# Patient Record
Sex: Female | Born: 1945 | Race: White | Hispanic: No | Marital: Married | Smoking: Never smoker
Health system: Southern US, Community
[De-identification: ages and names within clinical notes are randomized; demographics above are authoritative.]

## PROBLEM LIST (undated history)

## (undated) HISTORY — PX: APPENDECTOMY: SHX54

## (undated) HISTORY — PX: TONSILLECTOMY: SUR1361

---

## 2011-08-22 DIAGNOSIS — H2510 Age-related nuclear cataract, unspecified eye: Secondary | ICD-10-CM | POA: Diagnosis not present

## 2011-09-13 DIAGNOSIS — H2510 Age-related nuclear cataract, unspecified eye: Secondary | ICD-10-CM | POA: Diagnosis not present

## 2011-09-23 DIAGNOSIS — H2510 Age-related nuclear cataract, unspecified eye: Secondary | ICD-10-CM | POA: Diagnosis not present

## 2011-10-17 ENCOUNTER — Ambulatory Visit: Payer: Self-pay | Admitting: Ophthalmology

## 2011-10-17 DIAGNOSIS — H2510 Age-related nuclear cataract, unspecified eye: Secondary | ICD-10-CM | POA: Diagnosis not present

## 2011-10-17 DIAGNOSIS — Z79899 Other long term (current) drug therapy: Secondary | ICD-10-CM | POA: Diagnosis not present

## 2011-10-17 DIAGNOSIS — K219 Gastro-esophageal reflux disease without esophagitis: Secondary | ICD-10-CM | POA: Diagnosis not present

## 2011-10-17 DIAGNOSIS — Z9109 Other allergy status, other than to drugs and biological substances: Secondary | ICD-10-CM | POA: Diagnosis not present

## 2012-10-23 DIAGNOSIS — H2510 Age-related nuclear cataract, unspecified eye: Secondary | ICD-10-CM | POA: Diagnosis not present

## 2012-11-12 DIAGNOSIS — H2510 Age-related nuclear cataract, unspecified eye: Secondary | ICD-10-CM | POA: Diagnosis not present

## 2012-11-21 ENCOUNTER — Ambulatory Visit: Payer: Self-pay | Admitting: Ophthalmology

## 2012-11-21 DIAGNOSIS — Z79899 Other long term (current) drug therapy: Secondary | ICD-10-CM | POA: Diagnosis not present

## 2012-11-21 DIAGNOSIS — Z9109 Other allergy status, other than to drugs and biological substances: Secondary | ICD-10-CM | POA: Diagnosis not present

## 2012-11-21 DIAGNOSIS — H259 Unspecified age-related cataract: Secondary | ICD-10-CM | POA: Diagnosis not present

## 2012-11-21 DIAGNOSIS — R12 Heartburn: Secondary | ICD-10-CM | POA: Diagnosis not present

## 2012-11-21 DIAGNOSIS — H2510 Age-related nuclear cataract, unspecified eye: Secondary | ICD-10-CM | POA: Diagnosis not present

## 2013-07-15 DIAGNOSIS — H35059 Retinal neovascularization, unspecified, unspecified eye: Secondary | ICD-10-CM | POA: Diagnosis not present

## 2013-07-15 DIAGNOSIS — H538 Other visual disturbances: Secondary | ICD-10-CM | POA: Diagnosis not present

## 2013-09-02 DIAGNOSIS — H35059 Retinal neovascularization, unspecified, unspecified eye: Secondary | ICD-10-CM | POA: Diagnosis not present

## 2014-08-25 ENCOUNTER — Ambulatory Visit
Admit: 2014-08-25 | Disposition: A | Discharge status: Home / Self Care | Payer: Self-pay | Attending: Family Medicine | Admitting: Family Medicine

## 2014-08-25 DIAGNOSIS — S4991XA Unspecified injury of right shoulder and upper arm, initial encounter: Secondary | ICD-10-CM | POA: Diagnosis not present

## 2014-08-25 DIAGNOSIS — M7521 Bicipital tendinitis, right shoulder: Secondary | ICD-10-CM | POA: Diagnosis not present

## 2014-08-25 DIAGNOSIS — S63601A Unspecified sprain of right thumb, initial encounter: Secondary | ICD-10-CM | POA: Diagnosis not present

## 2014-08-25 DIAGNOSIS — Y9389 Activity, other specified: Secondary | ICD-10-CM | POA: Diagnosis not present

## 2014-08-25 DIAGNOSIS — S6991XA Unspecified injury of right wrist, hand and finger(s), initial encounter: Secondary | ICD-10-CM | POA: Diagnosis not present

## 2014-08-25 DIAGNOSIS — M79644 Pain in right finger(s): Secondary | ICD-10-CM | POA: Diagnosis not present

## 2014-08-25 DIAGNOSIS — Y92009 Unspecified place in unspecified non-institutional (private) residence as the place of occurrence of the external cause: Secondary | ICD-10-CM | POA: Diagnosis not present

## 2014-08-29 DIAGNOSIS — M7541 Impingement syndrome of right shoulder: Secondary | ICD-10-CM | POA: Diagnosis not present

## 2014-09-02 DIAGNOSIS — Z818 Family history of other mental and behavioral disorders: Secondary | ICD-10-CM | POA: Diagnosis not present

## 2014-09-02 DIAGNOSIS — J8410 Pulmonary fibrosis, unspecified: Secondary | ICD-10-CM | POA: Diagnosis not present

## 2014-09-02 DIAGNOSIS — Z23 Encounter for immunization: Secondary | ICD-10-CM | POA: Diagnosis not present

## 2014-09-02 DIAGNOSIS — M7521 Bicipital tendinitis, right shoulder: Secondary | ICD-10-CM | POA: Diagnosis not present

## 2014-09-12 DIAGNOSIS — M7541 Impingement syndrome of right shoulder: Secondary | ICD-10-CM | POA: Diagnosis not present

## 2014-09-24 DIAGNOSIS — M25511 Pain in right shoulder: Secondary | ICD-10-CM | POA: Diagnosis not present

## 2014-09-24 DIAGNOSIS — M7501 Adhesive capsulitis of right shoulder: Secondary | ICD-10-CM | POA: Diagnosis not present

## 2014-09-29 DIAGNOSIS — M25511 Pain in right shoulder: Secondary | ICD-10-CM | POA: Diagnosis not present

## 2014-09-29 DIAGNOSIS — M7501 Adhesive capsulitis of right shoulder: Secondary | ICD-10-CM | POA: Diagnosis not present

## 2014-10-02 DIAGNOSIS — M7501 Adhesive capsulitis of right shoulder: Secondary | ICD-10-CM | POA: Diagnosis not present

## 2014-10-02 DIAGNOSIS — M25511 Pain in right shoulder: Secondary | ICD-10-CM | POA: Diagnosis not present

## 2014-10-06 DIAGNOSIS — M25511 Pain in right shoulder: Secondary | ICD-10-CM | POA: Diagnosis not present

## 2014-10-06 DIAGNOSIS — M7501 Adhesive capsulitis of right shoulder: Secondary | ICD-10-CM | POA: Diagnosis not present

## 2015-03-02 DIAGNOSIS — T1501XA Foreign body in cornea, right eye, initial encounter: Secondary | ICD-10-CM | POA: Diagnosis not present

## 2015-03-03 DIAGNOSIS — T1501XD Foreign body in cornea, right eye, subsequent encounter: Secondary | ICD-10-CM | POA: Diagnosis not present

## 2015-03-05 ENCOUNTER — Ambulatory Visit: Payer: Self-pay | Admitting: Family Medicine

## 2015-03-06 ENCOUNTER — Encounter: Payer: Self-pay | Admitting: Family Medicine

## 2015-03-06 ENCOUNTER — Ambulatory Visit (INDEPENDENT_AMBULATORY_CARE_PROVIDER_SITE_OTHER): Payer: Medicare Other | Admitting: Family Medicine

## 2015-03-06 VITALS — BP 138/82 | HR 72 | Ht 65.5 in | Wt 154.8 lb

## 2015-03-06 DIAGNOSIS — H269 Unspecified cataract: Secondary | ICD-10-CM | POA: Diagnosis not present

## 2015-03-06 DIAGNOSIS — E663 Overweight: Secondary | ICD-10-CM | POA: Insufficient documentation

## 2015-03-06 DIAGNOSIS — J8410 Pulmonary fibrosis, unspecified: Secondary | ICD-10-CM

## 2015-03-06 DIAGNOSIS — M7521 Bicipital tendinitis, right shoulder: Secondary | ICD-10-CM | POA: Diagnosis not present

## 2015-03-06 DIAGNOSIS — Z23 Encounter for immunization: Secondary | ICD-10-CM | POA: Diagnosis not present

## 2015-03-06 DIAGNOSIS — Z820 Family history of epilepsy and other diseases of the nervous system: Secondary | ICD-10-CM

## 2015-03-06 DIAGNOSIS — Z818 Family history of other mental and behavioral disorders: Secondary | ICD-10-CM | POA: Insufficient documentation

## 2015-03-06 NOTE — Progress Notes (Signed)
Date:  03/06/2015   Name:  Irena ReichmannBetty Carter Majewski   DOB:  07/27/1945   MRN:  [id number]  PCP:  Schuyler AmorWilliam Theressa Piedra, MD    Chief Complaint: Follow-up   History of Present Illness:  This is a 69 y.o. female for f/u overweight, has lost 15# past six months, feeling well. R biceps tendonitis resolved, off Mobic. Received Tdap and Prevnar last visit, needs Pneumovax. Declines blood work/mammogram/colonoscopy.  Review of Systems:  Review of Systems  Constitutional: Negative for fever and unexpected weight change.  HENT: Negative for ear pain, sore throat and trouble swallowing.   Eyes: Negative for pain.  Respiratory: Negative for shortness of breath.   Cardiovascular: Negative for chest pain and leg swelling.  Gastrointestinal: Negative for abdominal pain.  Endocrine: Negative for polyuria.  Genitourinary: Negative for difficulty urinating.  Neurological: Negative for syncope and light-headedness.    Patient Active Problem List   Diagnosis Date Noted  . 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 Cataracts, bilateral 03/06/2015  . FH: dementia 03/06/2015  . Biceps tendonitis on right 03/06/2015  . Overweight (BMI 25.0-29.9) 03/06/2015    Prior to Admission medications   Not on File    Not on File  No past surgical history on file.  Social History  Substance Use Topics  . Smoking status: Not on file  . Smokeless tobacco: Not on file  . Alcohol Use: 0.0 oz/week    0 Standard drinks or equivalent per week    No family history on file.  Medication list has been reviewed and updated.  Physical Examination: BP 138/82 mmHg  Pulse 72  Ht 5' 5.5" (1.664 m)  Wt 154 lb 12.8 oz (70.217 kg)  BMI 25.36 kg/m2  Physical Exam  Constitutional: She is oriented to person, place, and time. She appears well-developed and well-nourished.  Cardiovascular: Normal rate, regular rhythm and normal heart sounds.   Pulmonary/Chest: Effort normal and breath sounds normal.  Musculoskeletal:  She exhibits no edema.  Neurological: She is alert and oriented to person, place, and time.  Skin: Skin is warm and dry.  Psychiatric: She has a normal mood and affect. Her behavior is normal.  Nursing note and vitals reviewed.   Assessment and Plan:  1. Overweight (BMI 25.0-29.9) Continue weight loss (goal < 150#)  2. Biceps tendonitis on right Resolved  3. Cataracts, bilateral Followed by optho  4. Calcified granuloma of lung (HCC)  5. FH: dementia Declines blood work  6. Need for pneumococcal imm Pneumovax given  Return in about 1 year (around 03/05/2016).  Dionne AnoWilliam M. Kingsley SpittlePlonk, Jr. MD [facility]  03/06/2015

## 2015-07-01 DIAGNOSIS — H538 Other visual disturbances: Secondary | ICD-10-CM | POA: Diagnosis not present

## 2015-08-26 DIAGNOSIS — H26493 Other secondary cataract, bilateral: Secondary | ICD-10-CM | POA: Diagnosis not present

## 2015-09-23 ENCOUNTER — Encounter: Payer: Self-pay | Admitting: Family Medicine

## 2015-09-23 ENCOUNTER — Ambulatory Visit (INDEPENDENT_AMBULATORY_CARE_PROVIDER_SITE_OTHER): Payer: Medicare Other | Admitting: Family Medicine

## 2015-09-23 VITALS — BP 122/91 | HR 82 | Temp 98.2°F | Resp 16 | Ht 65.5 in | Wt 161.0 lb

## 2015-09-23 DIAGNOSIS — E663 Overweight: Secondary | ICD-10-CM | POA: Diagnosis not present

## 2015-09-23 DIAGNOSIS — M130 Polyarthritis, unspecified: Secondary | ICD-10-CM

## 2015-09-23 DIAGNOSIS — R202 Paresthesia of skin: Secondary | ICD-10-CM

## 2015-09-23 DIAGNOSIS — E559 Vitamin D deficiency, unspecified: Secondary | ICD-10-CM | POA: Diagnosis not present

## 2015-09-24 LAB — COMPREHENSIVE METABOLIC PANEL
A/G RATIO: 1.7 (ref 1.2–2.2)
ALK PHOS: 121 IU/L — AB (ref 39–117)
ALT: 10 IU/L (ref 0–32)
AST: 19 IU/L (ref 0–40)
Albumin: 4.5 g/dL (ref 3.5–4.8)
BUN/Creatinine Ratio: 18 (ref 12–28)
BUN: 10 mg/dL (ref 8–27)
Bilirubin Total: 0.3 mg/dL (ref 0.0–1.2)
CO2: 24 mmol/L (ref 18–29)
Calcium: 9.5 mg/dL (ref 8.7–10.3)
Chloride: 99 mmol/L (ref 96–106)
Creatinine, Ser: 0.57 mg/dL (ref 0.57–1.00)
GFR calc Af Amer: 108 mL/min/{1.73_m2} (ref >59)
GFR calc non Af Amer: 94 mL/min/{1.73_m2} (ref >59)
GLOBULIN, TOTAL: 2.7 g/dL (ref 1.5–4.5)
Glucose: 82 mg/dL (ref 65–99)
POTASSIUM: 4.3 mmol/L (ref 3.5–5.2)
SODIUM: 142 mmol/L (ref 134–144)
Total Protein: 7.2 g/dL (ref 6.0–8.5)

## 2015-09-24 LAB — VITAMIN D 25 HYDROXY (VIT D DEFICIENCY, FRACTURES): VIT D 25 HYDROXY: 21.7 ng/mL — AB (ref 30.0–100.0)

## 2015-09-24 LAB — CBC
HEMOGLOBIN: 12.5 g/dL (ref 11.1–15.9)
Hematocrit: 38.4 % (ref 34.0–46.6)
MCH: 29.3 pg (ref 26.6–33.0)
MCHC: 32.6 g/dL (ref 31.5–35.7)
MCV: 90 fL (ref 79–97)
PLATELETS: 293 10*3/uL (ref 150–379)
RBC: 4.26 x10E6/uL (ref 3.77–5.28)
RDW: 13.3 % (ref 12.3–15.4)
WBC: 8 10*3/uL (ref 3.4–10.8)

## 2015-09-24 LAB — TSH: TSH: 0.888 u[IU]/mL (ref 0.450–4.500)

## 2015-09-24 LAB — B. BURGDORFI ANTIBODIES

## 2015-09-24 LAB — VITAMIN B12: VITAMIN B 12: 391 pg/mL (ref 211–946)

## 2015-09-24 LAB — SEDIMENTATION RATE: SED RATE: 21 mm/h (ref 0–40)

## 2015-09-24 NOTE — Progress Notes (Signed)
Date:  09/23/2015   Name:  Brittany Bennett   DOB:  10-Feb-1946   MRN:  [id number]  PCP:  Adline Potter, MD    Chief Complaint: Lyme Disease; Fatigue; Headache; Neck Pain; Numbness; and Joint Swelling   History of Present Illness:  This is a 70 y.o. female seen for six month f/u. Concerned may have Lyme dz, husband had in past and outside a lot. C/o stiff neck, back pain, headache, swelling PIP L middle finger, fatigue, intermittent tingling B hands and L leg past six weeks. Husband with similar sxs. No known tick exposure. Has started taking asa 325 mg qhs which seems to help. Weight up 5#.  Review of Systems:  Review of Systems  Constitutional: Negative for fever and chills.  Respiratory: Negative for cough and shortness of breath.   Cardiovascular: Negative for chest pain and leg swelling.  Endocrine: Negative for polyuria.  Genitourinary: Negative for difficulty urinating.  Skin: Negative for rash.  Neurological: Negative for syncope, weakness and light-headedness.  Hematological: Negative for adenopathy.    Patient Active Problem List   Diagnosis Date Noted  . Polyarthritis 09/23/2015  . Calcified granuloma of lung ([place]) 03/06/2015  . Cataracts, bilateral 03/06/2015  . FH: dementia 03/06/2015  . Biceps tendonitis on right 03/06/2015  . Overweight (BMI 25.0-29.9) 03/06/2015    Prior to Admission medications   Medication Sig Start Date End Date Taking? Authorizing Provider  aspirin 325 MG tablet Take 325 mg by mouth every 6 (six) hours as needed.   Yes Historical Provider, MD  Calcium Carbonate-Vitamin D (CALCIUM-VITAMIN D) 500-200 MG-UNIT tablet Take 1 tablet by mouth daily.   Yes Historical Provider, MD  Multiple Vitamin (MULTIVITAMIN) capsule Take 1 capsule by mouth daily.   Yes Historical Provider, MD  vitamin C (ASCORBIC ACID) 500 MG tablet Take 500 mg by mouth daily.   Yes Historical Provider, MD  vitamin E 400 UNIT capsule Take 400 Units by mouth daily.   Yes  Historical Provider, MD    No Known Allergies  History reviewed. No pertinent past surgical history.  Social History  Substance Use Topics  . Smoking status: Never Smoker   . Smokeless tobacco: None  . Alcohol Use: 0.0 oz/week    0 Standard drinks or equivalent per week    History reviewed. No pertinent family history.  Medication list has been reviewed and updated.  Physical Examination: BP 122/91 mmHg  Pulse 82  Temp(Src) 98.2 F (36.8 C) (Oral)  Resp 16  Ht 5' 5.5" (1.664 m)  Wt 161 lb (73.029 kg)  BMI 26.37 kg/m2  SpO2 98%  Physical Exam  Constitutional: She is oriented to person, place, and time. She appears well-developed and well-nourished.  Neck: Neck supple.  Cardiovascular: Normal rate, regular rhythm and normal heart sounds.   Pulmonary/Chest: Effort normal and breath sounds normal.  Musculoskeletal: She exhibits no edema.  Lymphadenopathy:    She has no cervical adenopathy.  Neurological: She is alert and oriented to person, place, and time.  Skin: Skin is warm and dry.  Psychiatric: She has a normal mood and affect. Her behavior is normal.  Nursing note and vitals reviewed.   Assessment and Plan:  1. Polyarthritis Suspect OA, doubt Lyme dz - Sed Rate (ESR) - B. [place] Antibodies  2. Overweight (BMI 25.0-29.9) Exercise/weigth loss discussed - Comprehensive Metabolic Panel (CMET) - CBC  3. Paresthesia Consider CTS if sxs persist - B12 - TSH  4. Vitamin D deficiency On supplement - Vitamin D (  25 hydroxy)  5. Med review Consider d/c vit E, vit C, calcium and decrease asa to 81 mg daily  Return in about 6 months (around 03/25/2016).  Satira Anis. [facility]  09/24/2015

## 2015-10-01 IMAGING — CR RIGHT THUMB 2+V
3 series · 3 of 3 positions shown · non-contrast
Comparison: None.

CLINICAL DATA: Pain following fall 2 weeks prior

EXAM:
RIGHT THUMB 2+V

[finger ap]
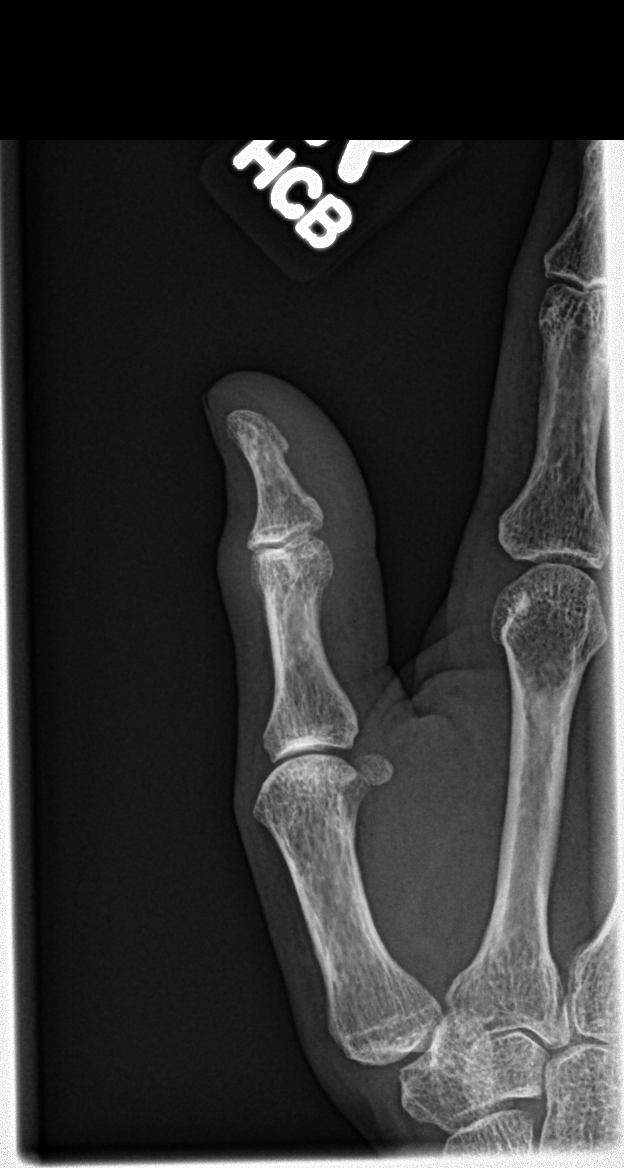

[finger obl]
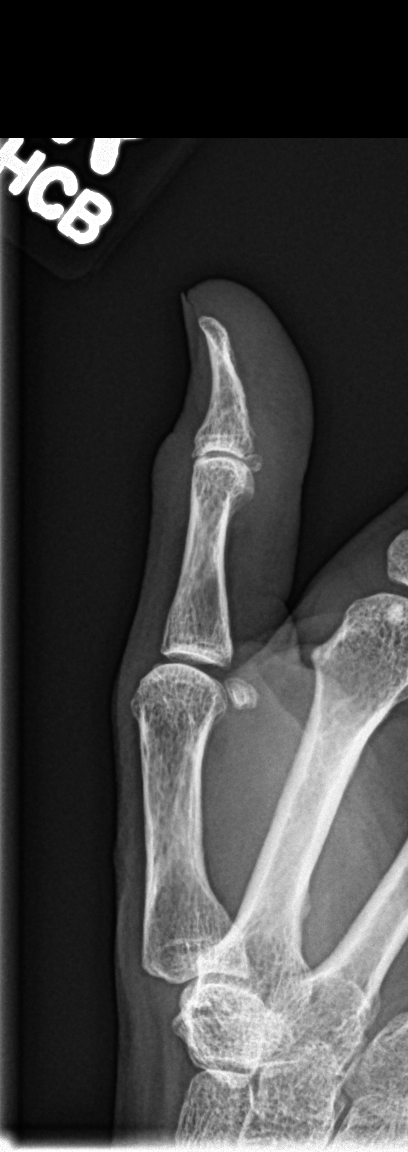

[finger lat]
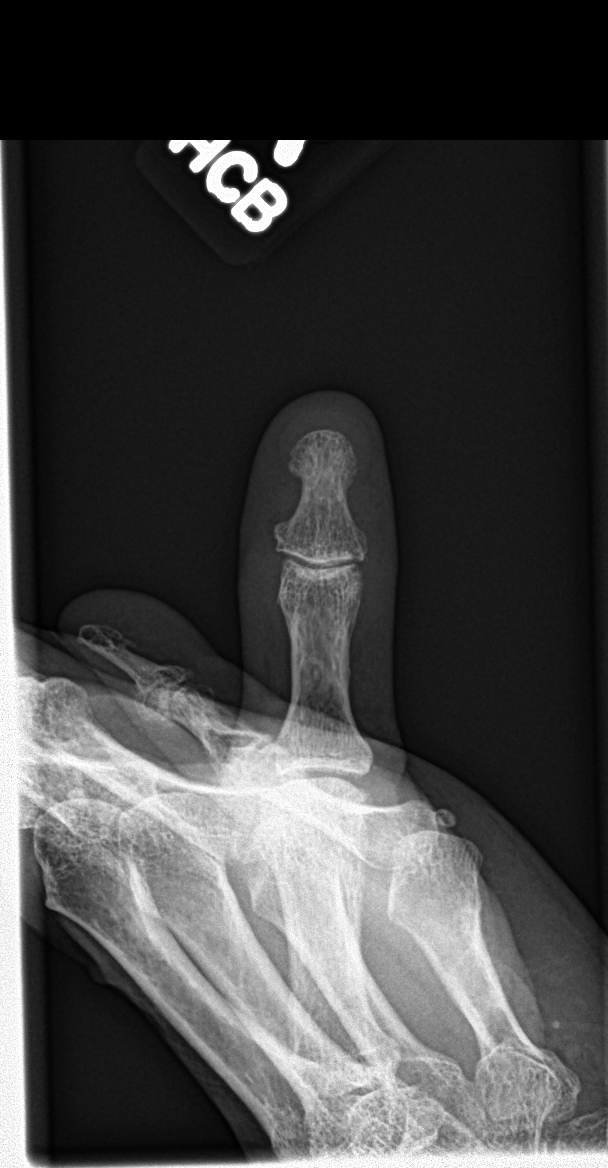

[3 of 3 positions shown; findings below may reference images not displayed]

FINDINGS: Frontal, oblique, and lateral views were obtained. There is no
demonstrable fracture or dislocation. There is mild osteoarthritic
change in the first IP joint. No erosive change or periostitis.
IMPRESSION: Osteoarthritic change in first IP joint. No fracture or dislocation.

## 2016-03-09 ENCOUNTER — Ambulatory Visit: Payer: Medicare Other | Admitting: Family Medicine

## 2018-01-05 ENCOUNTER — Ambulatory Visit
Admission: EM | Admit: 2018-01-05 | Discharge: 2018-01-05 | Disposition: A | Discharge status: Home / Self Care | Payer: Medicare Other | Attending: Family Medicine | Admitting: Family Medicine

## 2018-01-05 ENCOUNTER — Other Ambulatory Visit: Payer: Self-pay

## 2018-01-05 ENCOUNTER — Encounter: Payer: Self-pay | Admitting: Emergency Medicine

## 2018-01-05 DIAGNOSIS — H9203 Otalgia, bilateral: Secondary | ICD-10-CM

## 2018-01-05 NOTE — ED Triage Notes (Signed)
Patient in today c/o bilateral ear pain L>R. Patient states her ears feel stopped up. She states she tried to clean her left ear and had some bleeding when she did this.

## 2018-01-05 NOTE — ED Provider Notes (Signed)
[facility]    CSN: [id number] Arrival date & time: 01/05/18  1421     History   Chief Complaint Chief Complaint  Patient presents with  . Otalgia    HPI Irena ReichmannBetty Carter Umbarger is a 72 y.o. female.   72 yo female with a c/o left ear pressure, sensation of her ear "being stopped up" and slight discomfort/pain. States she's been flushing her ears with water and hydrogen peroxide in a syringe and states some "black stuff" came out of her left ear then she noticed blood coming out. Denies putting any other objects such as Q-tips in her ears. Denies any nasal congestion, runny nose, fevers.    The history is provided by the patient.  Otalgia    History reviewed. No pertinent past medical history.  Patient Active Problem List   Diagnosis Date Noted  . Polyarthritis 09/23/2015  . 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 Cataracts, bilateral 03/06/2015  . FH: dementia 03/06/2015  . Overweight (BMI 25.0-29.9) 03/06/2015    Past Surgical History:  Procedure Laterality Date  . APPENDECTOMY    . TONSILLECTOMY      OB History   None      Home Medications    Prior to Admission medications   Medication Sig Start Date End Date Taking? Authorizing Provider  Multiple Vitamin (MULTIVITAMIN) capsule Take 1 capsule by mouth daily.   Yes [provider]  aspirin 325 MG tablet Take 325 mg by mouth at bedtime.    [provider]  Calcium Carbonate-Vitamin D (CALCIUM-VITAMIN D) 500-200 MG-UNIT tablet Take 1 tablet by mouth daily.    [provider]  vitamin C (ASCORBIC ACID) 500 MG tablet Take 500 mg by mouth daily.    [provider]  vitamin E 400 UNIT capsule Take 400 Units by mouth daily.    [provider]    Family History Family History  Problem Relation Age of Onset  . Heart attack Mother   . Heart attack Father     Social History Social History   Tobacco Use  . Smoking status: Never Smoker  .  Smokeless tobacco: Never Used  Substance Use Topics  . Alcohol use: Yes    Alcohol/week: 0.0 standard drinks    Comment: rarely  . Drug use: No     Allergies   Patient has no known allergies.   Review of Systems Review of Systems  HENT: Positive for ear pain.      Physical Exam Triage Vital Signs ED Triage Vitals [01/05/18 1441]  Enc Vitals Group     BP 134/69     Pulse Rate 81     Resp 16     Temp 98.2 F (36.8 C)     Temp Source Oral     SpO2 99 %     Weight 145 lb (65.8 kg)     Height 5\' 5"  (1.651 m)     Head Circumference      Peak Flow      Pain Score 4     Pain Loc      Pain Edu?      Excl. in GC?    No data found.  Updated Vital Signs BP 134/69 (BP Location: Left Arm)   Pulse 81   Temp 98.2 F (36.8 C) (Oral)   Resp 16   Ht 5\' 5"  (1.651 m)   Wt 65.8 kg   SpO2 99%   BMI 24.13 kg/m   Visual  Acuity Right Eye Distance:   Left Eye Distance:   Bilateral Distance:    Right Eye Near:   Left Eye Near:    Bilateral Near:     Physical Exam  Constitutional: She appears well-developed and well-nourished. No distress.  HENT:  Right Ear: Tympanic membrane and ear canal normal.  Left Ear: Tympanic membrane normal.  Left ear canal with dried blood and small amount of cerumen; TM visualized and clear  Skin: She is not diaphoretic.  Nursing note and vitals reviewed.    UC Treatments / Results  Labs (all labs ordered are listed, but only abnormal results are displayed) Labs Reviewed - No data to display  EKG None  Radiology No results found.  Procedures Procedures (including critical care time)  Medications Ordered in UC Medications - No data to display  Initial Impression / Assessment and Plan / UC Course  I have reviewed the triage vital signs and the nursing notes.  Pertinent labs & imaging results that were available during my care of the patient were reviewed by me and considered in my medical decision making (see chart for  details).      Final Clinical Impressions(s) / UC Diagnoses   Final diagnoses:  Otalgia of both ears     Discharge Instructions     Don't flush  If not improved in 2-3 days then follow up with ENT    ED Prescriptions    None     1. diagnosis reviewed with patient 2. Recommend not flushing or inserting any objects in her ear and letting ear canal heal on it's own 3. If no improvement in symptoms in 2-3 days then follow up with ENT    Controlled Substance Prescriptions [place] Controlled Substance Registry consulted? Not Applicable   Payton Mccallumonty, Breyanna Valera, MD 01/05/18 1537

## 2018-01-05 NOTE — Discharge Instructions (Signed)
Don't flush  If not improved in 2-3 days then follow up with ENT
